# Patient Record
Sex: Male | Born: 1995 | Race: Black or African American | Hispanic: No | Marital: Married | Smoking: Never smoker
Health system: Southern US, Community
[De-identification: ages and names within clinical notes are randomized; demographics above are authoritative.]

---
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
## 2013-06-06 ENCOUNTER — Ambulatory Visit (INDEPENDENT_AMBULATORY_CARE_PROVIDER_SITE_OTHER): Payer: 59 | Admitting: Family Medicine

## 2013-06-06 ENCOUNTER — Encounter: Payer: Self-pay | Admitting: Family Medicine

## 2013-06-06 VITALS — BP 124/78 | Temp 98.6°F | Ht 70.0 in | Wt 202.2 lb

## 2013-06-06 DIAGNOSIS — R112 Nausea with vomiting, unspecified: Secondary | ICD-10-CM

## 2013-06-06 LAB — POCT URINALYSIS DIPSTICK
PH UA: 6
SPEC GRAV UA: 1.02

## 2013-06-06 MED ORDER — ONDANSETRON HCL 8 MG PO TABS: 8.0000 mg | ORAL_TABLET | Freq: Three times a day (TID) | ORAL | Status: DC | PRN

## 2013-06-06 NOTE — Progress Notes (Signed)
   Subjective:    Patient ID: Adam ShearsCharles M [name], male    DOB: 1996/04/08, 18 y.o.   MRN: [id number]  Emesis  This is a new problem. The current episode started in the past 7 days. The problem occurs intermittently. The problem has been unchanged. The emesis has an appearance of stomach contents. The maximum temperature recorded prior to his arrival was 102 - 102.9 F. The fever has been present for less than 1 day. Associated symptoms include diarrhea. The treatment provided no relief.      Review of Systems  Gastrointestinal: Positive for vomiting and diarrhea.   no cough wheeze fevers of gone away had a headache yesterday    Objective:   Physical Exam Lungs clear hearts regular eardrums normal throat is normal neck is supple abdomen soft  Patient not toxic urinalysis reviewed     Assessment & Plan:  Gastroenteritis viral process should gradually get better warning signs discussed call us if problems Zofran as needed for nausea if not doing better by Thursday will need lab work

## 2013-06-07 ENCOUNTER — Telehealth: Payer: Self-pay | Admitting: Family Medicine

## 2013-06-07 NOTE — Telephone Encounter (Signed)
Left message to return call 

## 2013-06-07 NOTE — Telephone Encounter (Signed)
Patient told to call back today if worse from yesterday. He is running a fever today and mom would like to know what the next step is.

## 2013-06-07 NOTE — Telephone Encounter (Signed)
Nurse to call, I recommend CBC liver metabolic 7, lipase stat. Also this patient having any cough sore throat runny nose? How his patient doing in regards to vomiting and diarrhea? What is the fever?

## 2013-06-09 ENCOUNTER — Other Ambulatory Visit: Payer: Self-pay | Admitting: *Deleted

## 2013-06-09 DIAGNOSIS — R1013 Epigastric pain: Principal | ICD-10-CM

## 2013-06-09 DIAGNOSIS — G8929 Other chronic pain: Secondary | ICD-10-CM

## 2013-06-15 NOTE — Telephone Encounter (Signed)
Talked with mom Lawson Fiscal(Lori) and she stated that patient is doing much better. Will call back if symptoms return or persists.

## 2013-07-10 ENCOUNTER — Ambulatory Visit (INDEPENDENT_AMBULATORY_CARE_PROVIDER_SITE_OTHER): Payer: 59 | Admitting: Family Medicine

## 2013-07-10 ENCOUNTER — Encounter: Payer: Self-pay | Admitting: Family Medicine

## 2013-07-10 VITALS — BP 132/80 | Temp 99.3°F | Ht 69.0 in | Wt 200.6 lb

## 2013-07-10 DIAGNOSIS — R197 Diarrhea, unspecified: Secondary | ICD-10-CM

## 2013-07-10 MED ORDER — CIPROFLOXACIN HCL 500 MG PO TABS: 500.0000 mg | ORAL_TABLET | Freq: Two times a day (BID) | ORAL | Status: DC

## 2013-07-10 NOTE — Progress Notes (Signed)
   Subjective:    Patient ID: Adam [name], male    DOB: [birth year], 18 y.o.   MRN: [id number]  Emesis  This is a new problem. The current episode started in the past 7 days. Associated symptoms include diarrhea and a fever.    Persistent symtoms for five days  diminishehed energy Positive history of appendectomy Temp 101.9  Just a little cough  Unable to work, at [employer]  No blood in stools. Moderate malaise. Patient [place] just 12 hours before onset of symptoms. Notes abdominal pain and tenderness intermittently. Review of Systems  Constitutional: Positive for fever.  Gastrointestinal: Positive for vomiting and diarrhea.   No rash ROS otherwise negative    Objective:   Physical Exam  Alert moderate malaise. Temp 99.3 HEENT normal. Lungs clear. Heart regular in rhythm. Abdomen hyperactive bowel sounds. No discrete tenderness. Some mild to moderate tenderness in left lower quadrant.      Assessment & Plan:  Impression probable viral gastroenteritis though cannot exclude foodborne illness discussed plan trial of Cipro 500 twice a day. Zofran when necessary for nausea from prior illness. No prescription antidiarrheals rationale discussed. WSL

## 2013-07-12 ENCOUNTER — Encounter: Payer: Self-pay | Admitting: Family Medicine

## 2013-07-18 ENCOUNTER — Ambulatory Visit (INDEPENDENT_AMBULATORY_CARE_PROVIDER_SITE_OTHER): Payer: 59 | Admitting: Family Medicine

## 2013-07-18 ENCOUNTER — Encounter: Payer: Self-pay | Admitting: Family Medicine

## 2013-07-18 VITALS — BP 122/78 | Ht 69.0 in | Wt 200.0 lb

## 2013-07-18 DIAGNOSIS — M25519 Pain in unspecified shoulder: Secondary | ICD-10-CM

## 2013-07-18 DIAGNOSIS — M75100 Unspecified rotator cuff tear or rupture of unspecified shoulder, not specified as traumatic: Secondary | ICD-10-CM

## 2013-07-18 NOTE — Progress Notes (Signed)
   Subjective:    Patient ID: Adam [name], male    DOB: May 27, 1995, 18 y.o.   MRN: [id number]  Shoulder Pain  The pain is present in the right shoulder. This is a chronic problem. The current episode started more than 1 year ago. He has tried nothing for the symptoms.    patient relates an injury when he played football several yers ago ever since his been weak painful he has a difficult time lifting moving the ar weakness in his scapula that he is noted.m without significant pain sometimes radiates into the arm does not go up into the neck. In addition to this patient also has Significant weakness when he tries to lift or push.  Review of Systems Relates mainly shoulder pain and discomfort denies numbness or tingling in the hands denies chest pain shortness breath.    Objective:   Physical Exam Significant laxity in the right shoulder along with what appears to be a significant tear of the rotator cuff he also has weakness of that right shoulder and his right shoulder blade comes out abnormally when he pushes. Reflexes are good hand strength good. Neck normal. Lungs clear heart regular.       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 We will go ahead and get him set up as soon as possible. I believe that this patient probably didn't need an MRI and possibly reconstruction.

## 2013-07-31 ENCOUNTER — Other Ambulatory Visit (HOSPITAL_COMMUNITY): Payer: Self-pay | Admitting: Orthopedic Surgery

## 2013-07-31 ENCOUNTER — Ambulatory Visit (HOSPITAL_COMMUNITY)
Admission: RE | Admit: 2013-07-31 | Discharge: 2013-07-31 | Disposition: A | Discharge status: Home / Self Care | Payer: 59 | Source: Ambulatory Visit | Attending: Orthopedic Surgery | Admitting: Orthopedic Surgery

## 2013-07-31 DIAGNOSIS — Z01818 Encounter for other preprocedural examination: Secondary | ICD-10-CM

## 2013-07-31 DIAGNOSIS — Z0389 Encounter for observation for other suspected diseases and conditions ruled out: Secondary | ICD-10-CM | POA: Insufficient documentation

## 2013-08-22 ENCOUNTER — Encounter: Payer: Self-pay | Admitting: Family Medicine

## 2013-08-22 ENCOUNTER — Telehealth: Payer: Self-pay | Admitting: Family Medicine

## 2013-08-22 NOTE — Telephone Encounter (Signed)
Please call [phone]

## 2013-08-22 NOTE — Telephone Encounter (Signed)
You may go ahead and write this excuse and I be happy to sign it thank you

## 2013-08-22 NOTE — Telephone Encounter (Signed)
Patient needs a work excuse covering him for 08/18/2013 through 08/21/2013 to return on 08/22/2013 due to his shoulder. 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

## 2013-08-22 NOTE — Telephone Encounter (Signed)
Also, needs to say going back to full duty, no restrictions.

## 2013-08-22 NOTE — Telephone Encounter (Signed)
done

## 2013-10-19 ENCOUNTER — Encounter: Payer: Self-pay | Admitting: Family Medicine

## 2013-10-19 ENCOUNTER — Ambulatory Visit (INDEPENDENT_AMBULATORY_CARE_PROVIDER_SITE_OTHER): Payer: 59 | Admitting: Family Medicine

## 2013-10-19 VITALS — BP 128/88 | Temp 98.6°F | Ht 69.0 in | Wt 207.0 lb

## 2013-10-19 DIAGNOSIS — R109 Unspecified abdominal pain: Secondary | ICD-10-CM

## 2013-10-19 MED ORDER — DICYCLOMINE HCL 20 MG PO TABS: 20.0000 mg | ORAL_TABLET | Freq: Three times a day (TID) | ORAL | Status: DC | PRN

## 2013-10-19 NOTE — Progress Notes (Signed)
   Subjective:    Patient ID: Adam [name], male    DOB: [birth year], 18 y.o.   MRN: [id number]  Abdominal Pain This is a new problem. Episode onset: Friday. The problem occurs intermittently. The pain is located in the RLQ. The quality of the pain is dull. The abdominal pain does not radiate. Associated symptoms include constipation and diarrhea. Nothing aggravates the pain. The pain is relieved by nothing. He has tried nothing for the symptoms.   The pain is actually more in the epigastric area and right midabdomen. PMH benign  Review of Systems  Gastrointestinal: Positive for abdominal pain, diarrhea and constipation.   denies fevers bloody stools dysuria     Objective:   Physical Exam  Vitals reviewed. Constitutional: He appears well-nourished. No distress.  Cardiovascular: Normal rate, regular rhythm and normal heart sounds.   No murmur heard. Pulmonary/Chest: Effort normal and breath sounds normal. No respiratory distress.  Abdominal: Soft. There is tenderness.  Musculoskeletal: He exhibits no edema.  Lymphadenopathy:    He has no cervical adenopathy.  Neurological: He is alert.  Psychiatric: His behavior is normal.    On physical exam tenderness is in the epigastric area there is no right lower quadrant tenderness I do not feel the patient needs CT scan      Assessment & Plan:  I feel this patient's abdominal pain more than likely virus but there is posterior to the pancreatic issues we will do lab work. He will use medication help the discomfort if he is not doing well by Monday he is to let us now at this present time did did not need for him to see gastroenterology or endoscopy.

## 2013-10-19 NOTE — Patient Instructions (Signed)

## 2013-12-06 ENCOUNTER — Encounter (HOSPITAL_COMMUNITY): Payer: Self-pay | Admitting: Emergency Medicine

## 2013-12-06 ENCOUNTER — Emergency Department (HOSPITAL_COMMUNITY)
Admission: EM | Admit: 2013-12-06 | Discharge: 2013-12-06 | Disposition: A | Discharge status: Home / Self Care | Payer: 59 | Attending: Emergency Medicine | Admitting: Emergency Medicine

## 2013-12-06 ENCOUNTER — Telehealth: Payer: Self-pay | Admitting: Family Medicine

## 2013-12-06 DIAGNOSIS — K219 Gastro-esophageal reflux disease without esophagitis: Secondary | ICD-10-CM | POA: Insufficient documentation

## 2013-12-06 DIAGNOSIS — I10 Essential (primary) hypertension: Secondary | ICD-10-CM | POA: Insufficient documentation

## 2013-12-06 DIAGNOSIS — E119 Type 2 diabetes mellitus without complications: Secondary | ICD-10-CM | POA: Insufficient documentation

## 2013-12-06 DIAGNOSIS — R51 Headache: Secondary | ICD-10-CM | POA: Insufficient documentation

## 2013-12-06 DIAGNOSIS — R002 Palpitations: Secondary | ICD-10-CM | POA: Insufficient documentation

## 2013-12-06 DIAGNOSIS — Z87891 Personal history of nicotine dependence: Secondary | ICD-10-CM | POA: Insufficient documentation

## 2013-12-06 LAB — CBG MONITORING, ED: Glucose-Capillary: 106 mg/dL — ABNORMAL HIGH (ref 70–99)

## 2013-12-06 MED ORDER — OMEPRAZOLE 20 MG PO CPDR: 20.0000 mg | DELAYED_RELEASE_CAPSULE | Freq: Every day | ORAL | Status: AC

## 2013-12-06 NOTE — Discharge Instructions (Signed)
Gastroesophageal Reflux Disease, Adult Gastroesophageal reflux disease (GERD) happens when acid from your stomach flows up into the esophagus. When acid comes in contact with the esophagus, the acid causes soreness (inflammation) in the esophagus. Over time, GERD may create small holes (ulcers) in the lining of the esophagus. CAUSES   Increased body weight. This puts pressure on the stomach, making acid rise from the stomach into the esophagus.  Smoking. This increases acid production in the stomach.  Drinking alcohol. This causes decreased pressure in the lower esophageal sphincter (valve or ring of muscle between the esophagus and stomach), allowing acid from the stomach into the esophagus.  Late evening meals and a full stomach. This increases pressure and acid production in the stomach.  A malformed lower esophageal sphincter. Sometimes, no cause is found. SYMPTOMS   Burning pain in the lower part of the mid-chest behind the breastbone and in the mid-stomach area. This may occur twice a week or more often.  Trouble swallowing.  Sore throat.  Dry cough.  Asthma-like symptoms including chest tightness, shortness of breath, or wheezing. DIAGNOSIS  Your caregiver may be able to diagnose GERD based on your symptoms. In some cases, X-rays and other tests may be done to check for complications or to check the condition of your stomach and esophagus. TREATMENT  Your caregiver may recommend over-the-counter or prescription medicines to help decrease acid production. Ask your caregiver before starting or adding any new medicines.  HOME CARE INSTRUCTIONS   Change the factors that you can control. Ask your caregiver for guidance concerning weight loss, quitting smoking, and alcohol consumption.  Avoid foods and drinks that make your symptoms worse, such as:  Caffeine or alcoholic drinks.  Chocolate.  Peppermint or mint flavorings.  Garlic and onions.  Spicy foods.  Citrus fruits,  such as oranges, lemons, or limes.  Tomato-based foods such as sauce, chili, salsa, and pizza.  Fried and fatty foods.  Avoid lying down for the 3 hours prior to your bedtime or prior to taking a nap.  Eat small, frequent meals instead of large meals.  Wear loose-fitting clothing. Do not wear anything tight around your waist that causes pressure on your stomach.  Raise the head of your bed 6 to 8 inches with wood blocks to help you sleep. Extra pillows will not help.  Only take over-the-counter or prescription medicines for pain, discomfort, or fever as directed by your caregiver.  Do not take aspirin, ibuprofen, or other nonsteroidal anti-inflammatory drugs (NSAIDs). SEEK IMMEDIATE MEDICAL CARE IF:   You have pain in your arms, neck, jaw, teeth, or back.  Your pain increases or changes in intensity or duration.  You develop nausea, vomiting, or sweating (diaphoresis).  You develop shortness of breath, or you faint.  Your vomit is green, yellow, black, or looks like coffee grounds or blood.  Your stool is red, bloody, or black. These symptoms could be signs of other problems, such as heart disease, gastric bleeding, or esophageal bleeding. MAKE SURE YOU:   Understand these instructions.  Will watch your condition.  Will get help right away if you are not doing well or get worse. Document Released: 02/11/2005 Document Revised: 07/27/2011 Document Reviewed: 11/21/2010 [employer] Patient Information 2015 [employer]. This information is not intended to replace advice given to you by your health care provider. Make sure you discuss any questions you have with your health care provider.  

## 2013-12-06 NOTE — ED Provider Notes (Signed)
Medical screening examination/treatment/procedure(s) were performed by non-physician practitioner and as supervising physician I was immediately available for consultation/collaboration.   EKG Interpretation None       Doug SouSam Quetzal Meany, MD 12/06/13 2337

## 2013-12-06 NOTE — Telephone Encounter (Signed)
Patient has been out of work for 7/17, 7/20, 7/21, 12/06/13 due to possible hypertension. He would like a work excuse excusing him for these days to return to work on 12/07/2013. It needs to include "may return to work on 12/07/2013 with no restrictions due to possible hypertension." Please fax to [phone].

## 2013-12-06 NOTE — ED Notes (Signed)
Per pt. Heart has been fluttering and feels that BP is elevated. Here for a follow up.

## 2013-12-06 NOTE — Telephone Encounter (Signed)
I spoke to patient to make sure nothing else was going on. He told me that he felt dizzy on Friday, he felt like he had increased HR, and he was a little congested. He denied chest pain, pressure, heaviness, and SOB. I told him if he develops any of these s/s, that he needs to go to ER. He verbalized understanding. He has an appt with Dr. Lorin PicketScott on 12/18/2013

## 2013-12-06 NOTE — ED Notes (Signed)
Courtney, PA-C, is at the bedside.  

## 2013-12-06 NOTE — ED Provider Notes (Signed)
CSN: [id number]     Arrival date & time 12/06/13  1719 History   This chart was scribed for Adam Piedra, PA-C working with Doug Sou, MD by Evon Slack, ED Scribe. This patient was seen in room TR07C/TR07C and the patient's care was started at 7:03 PM.      Chief Complaint  Patient presents with  . Follow-up   The history is provided by the patient. No language interpreter was used.   HPI Comments: Adam [name] is a 18 y.o. male who presents to the Emergency Department with multiple complaints.  Patient states that he is concerned that he is having palpitation, he is hypertensive, diabetic, and has indigestion.  Patient states that 5 days ago he felt that his heart was racing in his chest.  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  At that time his HR was 150 and his BP was 150/90.  He states that this lasted for 45 minutes and then went away.  Patient also states that he is concerned for diabetes.  He took his blood sugar an hour after eating and had a blood sugar of 120.  Patient's mother has both diabetes and HTN.  Patient has also noticed a one week history of burning substernal pain after eating.  He states that it feels like heartburn.  He is not currently taking any medications at this time.  He denies caffeine use.  He recently quit smoking last week.    History reviewed. No pertinent past medical history. Past Surgical History  Procedure Laterality Date  . Laparoscopic appendectomy  02/14/2012    Procedure: APPENDECTOMY LAPAROSCOPIC;  Surgeon: Fabio Bering, MD;  Location: [facility];  Service: General;  Laterality: N/A;   History reviewed. No pertinent family history. History  Substance Use Topics  . Smoking status: Former Smoker -- 0.25 packs/day    Types: Cigarettes    Quit date: 02/15/2012  . Smokeless tobacco: Not on file  . Alcohol Use: No    Review of Systems  Constitutional: Negative for fever, chills and fatigue.  Respiratory: Negative for  cough, chest tightness and shortness of breath.   Cardiovascular: Positive for palpitations. Negative for chest pain and leg swelling.  Gastrointestinal: Negative for nausea, vomiting, abdominal pain, diarrhea, constipation and blood in stool.  Genitourinary: Negative for hematuria.  Neurological: Positive for headaches. Negative for dizziness, syncope, speech difficulty, weakness, light-headedness and numbness.  All other systems reviewed and are negative.  Allergies  Review of patient's allergies indicates no known allergies.  Home Medications   Prior to Admission medications   Medication Sig Start Date End Date Taking? Authorizing Provider  omeprazole (PRILOSEC) 20 MG capsule Take 1 capsule (20 mg total) by mouth daily. 12/06/13   Mirissa Lopresti A Forcucci, PA-C   Triage Vitals: BP 129/75  Pulse 98  Temp(Src) 98.3 F (36.8 C) (Oral)  Resp 18  SpO2 99%  Physical Exam  Nursing note and vitals reviewed. Constitutional: He is oriented to person, place, and time. He appears well-developed and well-nourished. No distress.  HENT:  Head: Normocephalic and atraumatic.  Mouth/Throat: Oropharynx is clear and moist. No oropharyngeal exudate.  Eyes: Conjunctivae and EOM are normal. Pupils are equal, round, and reactive to light. No scleral icterus.  Neck: Normal range of motion. Neck supple. No tracheal deviation present. No thyromegaly present.  Cardiovascular: Normal rate, regular rhythm and normal heart sounds.  Exam reveals no gallop.   No murmur heard. Pulmonary/Chest: Effort normal and breath  sounds normal. No respiratory distress. He has no wheezes. He has no rales. He exhibits no tenderness.  Musculoskeletal: Normal range of motion.  Lymphadenopathy:    He has no cervical adenopathy.  Neurological: He is alert and oriented to person, place, and time. He has normal reflexes. No cranial nerve deficit or sensory deficit. Coordination normal.  Skin: Skin is warm and dry.  Psychiatric: He  has a normal mood and affect. His behavior is normal. Judgment and thought content normal.    ED Course  Procedures (including critical care time) DIAGNOSTIC STUDIES: Oxygen Saturation is 99% on RA, normal by my interpretation.    COORDINATION OF CARE:    Labs Review Labs Reviewed  CBG MONITORING, ED - Abnormal; Notable for the following:    Glucose-Capillary 106 (*)    All other components within normal limits    Imaging Review No results found.   EKG Interpretation None      MDM   Final diagnoses:  Gastroesophageal reflux disease without esophagitis   Patient is a 18 y.o. Male who presents to the ED with multiple complaints.  He was reassured that BP here is normal and glucose is also normal at this time.  Patient does have GERD symptoms.  Will prescribe omeprazole at this time.  Patient is not complaining of palpitations at this time.  He was offered an EKG at this time which he declined.  Patient was told to follow-up with his PCP for further discussion of HTN and diabetes.  There are no abnormal physical exam findings at this time.  Patient is stable for discharge.  I personally performed the services described in this documentation, which was scribed in my presence. The recorded information has been reviewed and is accurate.      Eben Burowourtney A Forcucci, PA-C 12/06/13 1955

## 2013-12-06 NOTE — ED Notes (Signed)
He states "i am worried i have pre-hypertension and diabetes." when asked if he has any complaints he states "im congested." he states he checked his blood sugar at home and it was 120

## 2013-12-06 NOTE — Telephone Encounter (Signed)
Pt would also like a call to tell him we faxed the note  Once Dr Brett CanalesSteve signs off on it

## 2013-12-07 ENCOUNTER — Encounter: Payer: Self-pay | Admitting: Family Medicine

## 2013-12-07 NOTE — Telephone Encounter (Signed)
He may have a note as requested for those dates. It should also stay on there that he was checked out in the ER and his blood pressure was good and he is suitable to return to work without restriction

## 2013-12-07 NOTE — Telephone Encounter (Signed)
Spoke with patient, note faxed.

## 2013-12-18 ENCOUNTER — Ambulatory Visit: Payer: 59 | Admitting: Nurse Practitioner

## 2014-01-02 ENCOUNTER — Emergency Department (HOSPITAL_BASED_OUTPATIENT_CLINIC_OR_DEPARTMENT_OTHER)
Admission: EM | Admit: 2014-01-02 | Discharge: 2014-01-02 | Disposition: A | Discharge status: Home / Self Care | Payer: Worker's Compensation | Attending: Emergency Medicine | Admitting: Emergency Medicine

## 2014-01-02 ENCOUNTER — Encounter (HOSPITAL_BASED_OUTPATIENT_CLINIC_OR_DEPARTMENT_OTHER): Payer: Self-pay | Admitting: Emergency Medicine

## 2014-01-02 DIAGNOSIS — Y990 Civilian activity done for income or pay: Secondary | ICD-10-CM | POA: Diagnosis not present

## 2014-01-02 DIAGNOSIS — W268XXA Contact with other sharp object(s), not elsewhere classified, initial encounter: Secondary | ICD-10-CM | POA: Diagnosis not present

## 2014-01-02 DIAGNOSIS — Y9389 Activity, other specified: Secondary | ICD-10-CM | POA: Insufficient documentation

## 2014-01-02 DIAGNOSIS — Y9289 Other specified places as the place of occurrence of the external cause: Secondary | ICD-10-CM | POA: Diagnosis not present

## 2014-01-02 DIAGNOSIS — Z23 Encounter for immunization: Secondary | ICD-10-CM | POA: Insufficient documentation

## 2014-01-02 DIAGNOSIS — S61409A Unspecified open wound of unspecified hand, initial encounter: Secondary | ICD-10-CM | POA: Diagnosis present

## 2014-01-02 DIAGNOSIS — Z87891 Personal history of nicotine dependence: Secondary | ICD-10-CM | POA: Diagnosis not present

## 2014-01-02 DIAGNOSIS — IMO0002 Reserved for concepts with insufficient information to code with codable children: Secondary | ICD-10-CM

## 2014-01-02 DIAGNOSIS — Z79899 Other long term (current) drug therapy: Secondary | ICD-10-CM | POA: Diagnosis not present

## 2014-01-02 MED ORDER — TETANUS-DIPHTH-ACELL PERTUSSIS 5-2.5-18.5 LF-MCG/0.5 IM SUSP
0.5000 mL | Freq: Once | INTRAMUSCULAR | Status: AC
  Administered 2014-01-02: 0.5 mL via INTRAMUSCULAR
  Filled 2014-01-02: qty 0.5

## 2014-01-02 NOTE — ED Notes (Signed)
Suture cart placed at bedside. 

## 2014-01-02 NOTE — ED Notes (Addendum)
Patient states he cut his left hand on a piece of metal today at work. Patient states the laceration was "deep enough to see a tendon". Bandage in place at this time, bleeding controlled. Patient is unaware of his last tetanus shot. Patient is A&Ox4, NAD noted.

## 2014-01-02 NOTE — ED Provider Notes (Signed)
CSN: [id number]     Arrival date & time 01/02/14  0006 History   First MD Initiated Contact with Patient 01/02/14 0144     Chief Complaint  Patient presents with  . Extremity Laceration     (Consider location/radiation/quality/duration/timing/severity/associated sxs/prior Treatment) Patient is a 18 y.o. male presenting with skin laceration. The history is provided by the patient.  Laceration Location:  Hand Hand laceration location:  Dorsum of L hand Depth:  Through dermis Quality: straight   Bleeding: controlled   Laceration mechanism:  Metal edge Pain details:    Quality:  Aching   Severity:  Mild   Timing:  Constant   Progression:  Unchanged Foreign body present:  No foreign bodies Relieved by:  Nothing Worsened by:  Nothing tried Ineffective treatments:  None tried Tetanus status:  Unknown   History reviewed. No pertinent past medical history. Past Surgical History  Procedure Laterality Date  . Laparoscopic appendectomy  02/14/2012    Procedure: APPENDECTOMY LAPAROSCOPIC;  Surgeon: Fabio BeringBrent C Ziegler, MD;  Location: [facility];  Service: General;  Laterality: N/A;   History reviewed. No pertinent family history. History  Substance Use Topics  . Smoking status: Former Smoker -- 0.25 packs/day    Types: Cigarettes    Quit date: 02/15/2012  . Smokeless tobacco: Not on file  . Alcohol Use: No    Review of Systems  Skin: Positive for wound.  All other systems reviewed and are negative.     Allergies  Review of patient's allergies indicates no known allergies.  Home Medications   Prior to Admission medications   Medication Sig Start Date End Date Taking? Authorizing Provider  omeprazole (PRILOSEC) 20 MG capsule Take 1 capsule (20 mg total) by mouth daily. 12/06/13   Courtney A Forcucci, PA-C   BP 130/74  Pulse 75  Temp(Src) 98.4 F (36.9 C) (Oral)  Resp 16  Ht 5\' 9"  (1.753 m)  Wt 210 lb (95.255 kg)  BMI 31.00 kg/m2  SpO2 99% Physical Exam  Constitutional:  He is oriented to person, place, and time. He appears well-developed and well-nourished. No distress.  HENT:  Head: Normocephalic and atraumatic.  Eyes: EOM are normal.  Neck: Normal range of motion. Neck supple.  Cardiovascular: Normal rate, regular rhythm and intact distal pulses.   Pulmonary/Chest: Effort normal and breath sounds normal.  Abdominal: Soft. Bowel sounds are normal.  Musculoskeletal: Normal range of motion.       Left hand: He exhibits normal range of motion, no tenderness, no bony tenderness, normal two-point discrimination, normal capillary refill, no deformity, no laceration and no swelling. Normal sensation noted. Normal strength noted.       Hands: Neurological: He is alert and oriented to person, place, and time.  Skin: Skin is warm and dry.  Psychiatric: He has a normal mood and affect.    ED Course  Procedures (including critical care time) Labs Review Labs Reviewed - No data to display  Imaging Review No results found.   EKG Interpretation None      MDM   Final diagnoses:  Laceration    LACERATION REPAIR Performed by: Jasmine AwePALUMBO-RASCH,Velma Agnes K Authorized by: Jasmine AwePALUMBO-RASCH,Halah Whiteside K Consent: Verbal consent obtained. Risks and benefits: risks, benefits and alternatives were discussed Consent given by: patient Patient identity confirmed: provided demographic data Prepped and Draped in normal sterile fashion Wound explored  Laceration Location: left dorsal hand  Laceration Length: 1 cm  No Foreign Bodies seen or palpated  Irrigation method: syringe Amount of cleaning: standard  Skin closure: dermabond  Technique: dermabond  Patient tolerance: Patient tolerated the procedure well with no immediate complications.     Jasmine Awe, MD 01/02/14 [phone]

## 2014-01-02 NOTE — ED Notes (Signed)
Pt c/o laceration to left hand by metal x 1 hr ago

## 2014-01-09 ENCOUNTER — Ambulatory Visit (INDEPENDENT_AMBULATORY_CARE_PROVIDER_SITE_OTHER): Payer: 59 | Admitting: Family Medicine

## 2014-01-09 ENCOUNTER — Encounter: Payer: Self-pay | Admitting: Family Medicine

## 2014-01-09 VITALS — BP 124/84 | Ht 69.0 in | Wt 202.0 lb

## 2014-01-09 DIAGNOSIS — J310 Chronic rhinitis: Secondary | ICD-10-CM

## 2014-01-09 DIAGNOSIS — J329 Chronic sinusitis, unspecified: Secondary | ICD-10-CM

## 2014-01-09 MED ORDER — CEFDINIR 300 MG PO CAPS
300.0000 mg | ORAL_CAPSULE | Freq: Two times a day (BID) | ORAL | Status: AC
Start: 2014-01-09 — End: ?

## 2014-01-09 NOTE — Progress Notes (Signed)
   Subjective:    Patient ID: Adam [name], male    DOB: [birth year], 18 y.o.   MRN: [id number]  HPI Patient has been having a headache since last Friday. Patient states that he has experienced some dizziness. Patient states he has had a increase in his bowel movements and they are normal.  Congestion at times, unsteady,  Using two motrin prn head ache  Patient has a tendency toward sinus infections. Some sore throat morning. Some unsteadiness. Headache primarily frontal. Somewhat diminished energy. Next  No fever. No rash. No tick bite.   Review of Systems No vomiting no diarrhea see above ROS otherwise negative    Objective:   Physical Exam  Alert slight malaise no acute distress. Lungs clear. Heart rare in rhythm. HEENT normal other than frontal maxillary fullness and congestion      Assessment & Plan:  Impression rhinosinusitis plan antibiotics prescribed. Symptomatic care discussed. WSL

## 2014-05-09 ENCOUNTER — Emergency Department (HOSPITAL_COMMUNITY): Payer: No Typology Code available for payment source

## 2014-05-09 ENCOUNTER — Encounter (HOSPITAL_COMMUNITY): Payer: Self-pay | Admitting: *Deleted

## 2014-05-09 ENCOUNTER — Emergency Department (HOSPITAL_COMMUNITY)
Admission: EM | Admit: 2014-05-09 | Discharge: 2014-05-09 | Disposition: A | Discharge status: Home / Self Care | Payer: No Typology Code available for payment source | Attending: Emergency Medicine | Admitting: Emergency Medicine

## 2014-05-09 DIAGNOSIS — S299XXA Unspecified injury of thorax, initial encounter: Secondary | ICD-10-CM | POA: Diagnosis not present

## 2014-05-09 DIAGNOSIS — R1084 Generalized abdominal pain: Secondary | ICD-10-CM

## 2014-05-09 DIAGNOSIS — R0789 Other chest pain: Secondary | ICD-10-CM

## 2014-05-09 DIAGNOSIS — S3991XA Unspecified injury of abdomen, initial encounter: Secondary | ICD-10-CM | POA: Diagnosis not present

## 2014-05-09 DIAGNOSIS — R0781 Pleurodynia: Secondary | ICD-10-CM

## 2014-05-09 DIAGNOSIS — Z79899 Other long term (current) drug therapy: Secondary | ICD-10-CM | POA: Insufficient documentation

## 2014-05-09 DIAGNOSIS — Y9389 Activity, other specified: Secondary | ICD-10-CM | POA: Diagnosis not present

## 2014-05-09 DIAGNOSIS — Y92410 Unspecified street and highway as the place of occurrence of the external cause: Secondary | ICD-10-CM | POA: Insufficient documentation

## 2014-05-09 DIAGNOSIS — Z87891 Personal history of nicotine dependence: Secondary | ICD-10-CM | POA: Insufficient documentation

## 2014-05-09 DIAGNOSIS — Y998 Other external cause status: Secondary | ICD-10-CM | POA: Insufficient documentation

## 2014-05-09 LAB — COMPREHENSIVE METABOLIC PANEL
ALT: 13 U/L (ref 0–53)
AST: 15 U/L (ref 0–37)
Albumin: 4.1 g/dL (ref 3.5–5.2)
Alkaline Phosphatase: 61 U/L (ref 39–117)
Anion gap: 6 (ref 5–15)
BILIRUBIN TOTAL: 0.5 mg/dL (ref 0.3–1.2)
BUN: 10 mg/dL (ref 6–23)
CHLORIDE: 107 meq/L (ref 96–112)
CO2: 26 mmol/L (ref 19–32)
CREATININE: 0.97 mg/dL (ref 0.50–1.35)
Calcium: 9.2 mg/dL (ref 8.4–10.5)
GLUCOSE: 97 mg/dL (ref 70–99)
Potassium: 3.7 mmol/L (ref 3.5–5.1)
Sodium: 139 mmol/L (ref 135–145)
Total Protein: 6.7 g/dL (ref 6.0–8.3)

## 2014-05-09 LAB — URINALYSIS, ROUTINE W REFLEX MICROSCOPIC
GLUCOSE, UA: NEGATIVE mg/dL
HGB URINE DIPSTICK: NEGATIVE
Ketones, ur: 15 mg/dL — AB
LEUKOCYTES UA: NEGATIVE
Nitrite: NEGATIVE
PH: 7 (ref 5.0–8.0)
Protein, ur: NEGATIVE mg/dL
Specific Gravity, Urine: 1.029 (ref 1.005–1.030)
Urobilinogen, UA: 1 mg/dL (ref 0.0–1.0)

## 2014-05-09 LAB — CBC WITH DIFFERENTIAL/PLATELET
BASOS ABS: 0 10*3/uL (ref 0.0–0.1)
Basophils Relative: 0 % (ref 0–1)
EOS PCT: 1 % (ref 0–5)
Eosinophils Absolute: 0.1 10*3/uL (ref 0.0–0.7)
HEMATOCRIT: 41.1 % (ref 39.0–52.0)
HEMOGLOBIN: 14.4 g/dL (ref 13.0–17.0)
LYMPHS ABS: 1.9 10*3/uL (ref 0.7–4.0)
LYMPHS PCT: 24 % (ref 12–46)
MCH: 31.2 pg (ref 26.0–34.0)
MCHC: 35 g/dL (ref 30.0–36.0)
MCV: 89.2 fL (ref 78.0–100.0)
MONO ABS: 0.6 10*3/uL (ref 0.1–1.0)
MONOS PCT: 7 % (ref 3–12)
NEUTROS ABS: 5.6 10*3/uL (ref 1.7–7.7)
Neutrophils Relative %: 68 % (ref 43–77)
Platelets: 174 10*3/uL (ref 150–400)
RBC: 4.61 MIL/uL (ref 4.22–5.81)
RDW: 12.2 % (ref 11.5–15.5)
WBC: 8.2 10*3/uL (ref 4.0–10.5)

## 2014-05-09 LAB — LIPASE, BLOOD: LIPASE: 24 U/L (ref 11–59)

## 2014-05-09 MED ORDER — HYDROCODONE-ACETAMINOPHEN 5-325 MG PO TABS
1.0000 | ORAL_TABLET | Freq: Once | ORAL | Status: AC
  Administered 2014-05-09: 1 via ORAL
  Filled 2014-05-09: qty 1

## 2014-05-09 MED ORDER — IBUPROFEN 800 MG PO TABS: 800.0000 mg | ORAL_TABLET | Freq: Three times a day (TID) | ORAL | Status: AC

## 2014-05-09 MED ORDER — IOHEXOL 300 MG/ML  SOLN
100.0000 mL | Freq: Once | INTRAMUSCULAR | Status: AC | PRN
  Administered 2014-05-09: 100 mL via INTRAVENOUS

## 2014-05-09 MED ORDER — CYCLOBENZAPRINE HCL 5 MG PO TABS: 5.0000 mg | ORAL_TABLET | Freq: Three times a day (TID) | ORAL | Status: AC | PRN
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
## 2014-05-09 NOTE — ED Notes (Signed)
Patient transported to X-ray 

## 2014-05-09 NOTE — ED Notes (Signed)
MD at bedside. 
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
## 2014-05-09 NOTE — Discharge Instructions (Signed)

## 2014-05-31 ENCOUNTER — Ambulatory Visit: Payer: Self-pay | Admitting: Family Medicine

## 2014-06-11 ENCOUNTER — Ambulatory Visit: Payer: Self-pay | Admitting: Family Medicine

## 2014-09-21 ENCOUNTER — Emergency Department (HOSPITAL_COMMUNITY): Payer: Self-pay

## 2014-09-21 ENCOUNTER — Encounter (HOSPITAL_COMMUNITY): Payer: Self-pay | Admitting: Emergency Medicine

## 2014-09-21 ENCOUNTER — Emergency Department (HOSPITAL_COMMUNITY)
Admission: EM | Admit: 2014-09-21 | Discharge: 2014-09-21 | Disposition: A | Discharge status: Home / Self Care | Payer: Self-pay | Attending: Emergency Medicine | Admitting: Emergency Medicine

## 2014-09-21 DIAGNOSIS — R1032 Left lower quadrant pain: Secondary | ICD-10-CM | POA: Insufficient documentation

## 2014-09-21 DIAGNOSIS — Z87891 Personal history of nicotine dependence: Secondary | ICD-10-CM | POA: Insufficient documentation

## 2014-09-21 DIAGNOSIS — R112 Nausea with vomiting, unspecified: Secondary | ICD-10-CM | POA: Insufficient documentation

## 2014-09-21 LAB — CBC WITH DIFFERENTIAL/PLATELET
BASOS PCT: 0 % (ref 0–1)
Basophils Absolute: 0 10*3/uL (ref 0.0–0.1)
Eosinophils Absolute: 0.2 10*3/uL (ref 0.0–0.7)
Eosinophils Relative: 2 % (ref 0–5)
HEMATOCRIT: 43.1 % (ref 39.0–52.0)
Hemoglobin: 14.4 g/dL (ref 13.0–17.0)
LYMPHS PCT: 24 % (ref 12–46)
Lymphs Abs: 1.7 10*3/uL (ref 0.7–4.0)
MCH: 30.4 pg (ref 26.0–34.0)
MCHC: 33.4 g/dL (ref 30.0–36.0)
MCV: 91.1 fL (ref 78.0–100.0)
Monocytes Absolute: 0.5 10*3/uL (ref 0.1–1.0)
Monocytes Relative: 7 % (ref 3–12)
NEUTROS ABS: 4.9 10*3/uL (ref 1.7–7.7)
NEUTROS PCT: 67 % (ref 43–77)
PLATELETS: 205 10*3/uL (ref 150–400)
RBC: 4.73 MIL/uL (ref 4.22–5.81)
RDW: 12.3 % (ref 11.5–15.5)
WBC: 7.3 10*3/uL (ref 4.0–10.5)

## 2014-09-21 LAB — COMPREHENSIVE METABOLIC PANEL
ALT: 22 U/L (ref 17–63)
AST: 24 U/L (ref 15–41)
Albumin: 4.1 g/dL (ref 3.5–5.0)
Alkaline Phosphatase: 83 U/L (ref 38–126)
Anion gap: 10 (ref 5–15)
BILIRUBIN TOTAL: 0.4 mg/dL (ref 0.3–1.2)
BUN: 9 mg/dL (ref 6–20)
CALCIUM: 9.5 mg/dL (ref 8.9–10.3)
CO2: 27 mmol/L (ref 22–32)
CREATININE: 1 mg/dL (ref 0.61–1.24)
Chloride: 104 mmol/L (ref 101–111)
GFR calc Af Amer: >60 mL/min (ref >60)
GLUCOSE: 97 mg/dL (ref 70–99)
Potassium: 3.9 mmol/L (ref 3.5–5.1)
Sodium: 141 mmol/L (ref 135–145)
Total Protein: 7.4 g/dL (ref 6.5–8.1)

## 2014-09-21 LAB — URINALYSIS, ROUTINE W REFLEX MICROSCOPIC
BILIRUBIN URINE: NEGATIVE
GLUCOSE, UA: NEGATIVE mg/dL
HGB URINE DIPSTICK: NEGATIVE
Ketones, ur: NEGATIVE mg/dL
LEUKOCYTES UA: NEGATIVE
Nitrite: NEGATIVE
PROTEIN: NEGATIVE mg/dL
Specific Gravity, Urine: 1.012 (ref 1.005–1.030)
Urobilinogen, UA: 0.2 mg/dL (ref 0.0–1.0)
pH: 7 (ref 5.0–8.0)

## 2014-09-21 LAB — LIPASE, BLOOD: Lipase: 23 U/L (ref 22–51)

## 2014-09-21 MED ORDER — ONDANSETRON HCL 4 MG/2ML IJ SOLN
4.0000 mg | Freq: Once | INTRAMUSCULAR | Status: AC
  Administered 2014-09-21: 4 mg via INTRAVENOUS
  Filled 2014-09-21: qty 2

## 2014-09-21 MED ORDER — MORPHINE SULFATE 4 MG/ML IJ SOLN
4.0000 mg | Freq: Once | INTRAMUSCULAR | Status: AC
  Administered 2014-09-21: 4 mg via INTRAVENOUS
  Filled 2014-09-21: qty 1

## 2014-09-21 NOTE — ED Provider Notes (Signed)
CSN: [id number]     Arrival date & time 09/21/14  0001 History    This chart was scribed for Pricilla LovelessScott Sorina Derrig, MD by Freida Busmaniana Omoyeni, ED Scribe. This patient was seen in room D31C/D31C and the patient's care was started 12:31 AM.  Chief Complaint  Patient presents with  . Abdominal Pain   The history is provided by the patient. No language interpreter was used.     HPI Comments:  Adam [name] is a 19 y.o. male who presents to the Emergency Department complaining of shooting, sharp, non-radiating, sudden onset, left lower abdominal pain for the past 2 hours. He reports constant pain that waxes and wanes in severity. His pain is a  6/10 at this time. He reports associated nausea and one episode of vomiting ~2200/2230.  He denies testicular pain. He also denies a h/o kidney stones. No alleviating factors noted. Pt states he had an appendectomy a few years ago but a subsequent hospital visit showed he still had his appendix. He notes pain at this time is similar to pain felt prior to the appendectomy.   History reviewed. No pertinent past medical history. Past Surgical History  Procedure Laterality Date  . Laparoscopic appendectomy  02/14/2012    Procedure: APPENDECTOMY LAPAROSCOPIC;  Surgeon: Fabio BeringBrent C Ziegler, MD;  Location: [facility];  Service: General;  Laterality: N/A;   No family history on file. History  Substance Use Topics  . Smoking status: Former Smoker -- 0.25 packs/day    Types: Cigarettes    Quit date: 02/15/2012  . Smokeless tobacco: Not on file  . Alcohol Use: No    Review of Systems  Constitutional: Negative for fever.  Gastrointestinal: Positive for nausea, vomiting and abdominal pain. Negative for diarrhea and constipation.  Genitourinary: Negative for dysuria, hematuria and testicular pain.  Musculoskeletal: Negative for back pain.  All other systems reviewed and are negative.     Allergies  Review of patient's allergies indicates no known allergies.  Home  Medications   Prior to Admission medications   Medication Sig Start Date End Date Taking? Authorizing Provider  cefdinir (OMNICEF) 300 MG capsule Take 1 capsule (300 mg total) by mouth 2 (two) times daily. Patient not taking: Reported on 05/09/2014 01/09/14   Merlyn AlbertWilliam S Luking, MD  cyclobenzaprine (FLEXERIL) 5 MG tablet Take 1 tablet (5 mg total) by mouth 3 (three) times daily as needed for muscle spasms. Patient not taking: Reported on 09/21/2014 05/09/14   Alvira MondayErin Schlossman, MD  ibuprofen (ADVIL,MOTRIN) 800 MG tablet Take 1 tablet (800 mg total) by mouth 3 (three) times daily. Patient not taking: Reported on 09/21/2014 05/09/14   Alvira MondayErin Schlossman, MD  omeprazole (PRILOSEC) 20 MG capsule Take 1 capsule (20 mg total) by mouth daily. Patient not taking: Reported on 05/09/2014 12/06/13   Toni Amendourtney Forcucci, PA-C   BP 107/92 mmHg  Pulse 82  Temp(Src) 98.6 F (37 C) (Oral)  Resp 14  Ht 5\' 9"  (1.753 m)  Wt 216 lb 3.2 oz (98.068 kg)  BMI 31.91 kg/m2  SpO2 97% Physical Exam  Constitutional: He is oriented to person, place, and time. He appears well-developed and well-nourished. No distress.  HENT:  Head: Normocephalic and atraumatic.  Right Ear: External ear normal.  Left Ear: External ear normal.  Nose: Nose normal.  Eyes: Right eye exhibits no discharge. Left eye exhibits no discharge.  Neck: Neck supple.  Cardiovascular: Normal rate, regular rhythm, normal heart sounds and intact distal pulses.   Pulmonary/Chest: Effort normal.  Abdominal: Soft.  There is tenderness (mild) in the left lower quadrant.  Mild left sided CVA tenderness  Musculoskeletal: He exhibits no edema.  Neurological: He is alert and oriented to person, place, and time.  Skin: Skin is warm and dry. He is not diaphoretic.  Nursing note and vitals reviewed.   ED Course  Procedures   DIAGNOSTIC STUDIES:  Oxygen Saturation is 97% on RA, normal by my interpretation.    COORDINATION OF CARE:  12:34 AM Discussed treatment  plan with pt at bedside and pt agreed to plan.  Labs Review Labs Reviewed  CBC WITH DIFFERENTIAL/PLATELET  COMPREHENSIVE METABOLIC PANEL  LIPASE, BLOOD  URINALYSIS, ROUTINE W REFLEX MICROSCOPIC    Imaging Review Ct Renal Stone Study  09/21/2014   CLINICAL DATA:  Constant pain and left lower quadrant left flank. Nausea. Prior appendectomy.  EXAM: CT ABDOMEN AND PELVIS WITHOUT CONTRAST  TECHNIQUE: Multidetector CT imaging of the abdomen and pelvis was performed following the standard protocol without IV contrast.  COMPARISON:  CT 05/09/2014  FINDINGS: Lower chest: Lung bases are clear.  Hepatobiliary: No focal hepatic lesion. No biliary duct dilatation. Gallbladder is normal. Common bile duct is normal.  Pancreas: Pancreas is normal. No ductal dilatation. No pancreatic inflammation.  Spleen: Normal spleen  Adrenals/urinary tract: Adrenal glands are normal. No nephrolithiasis or ureterolithiasis. No obstructive uropathy. No bladder calculi.  Stomach/Bowel: Stomach, small bowel, and cecum are normal. The colon and rectosigmoid colon are normal.  Vascular/Lymphatic: Abdominal aorta is normal caliber. There is no retroperitoneal or periportal lymphadenopathy. No pelvic lymphadenopathy.  Reproductive: Prostate gland is normal. No inguinal hernia or ventral hernia.  Musculoskeletal: No aggressive osseous lesion.  Other: No free fluid.  IMPRESSION: 1. No nephrolithiasis, ureterolithiasis, or obstructive uropathy. 2. No inguinal or ventral hernia. 3. No diverticulitis.   Electronically Signed   By: Genevive BiStewart  Edmunds M.D.   On: 09/21/2014 01:21     EKG Interpretation None      MDM   Final diagnoses:  Left lower quadrant pain    Lab work and CT imaging is unremarkable. No testicular/GU symptoms. Patient's pain is all left-sided and thus I doubt appendicitis. No signs of obstruction or other acute intra-abdominal emergency. At this point his pain is of unclear etiology but he is well appearing and has  only mild tenderness with a soft, non-peritoneal date abdomen. We'll recommend ibuprofen as needed, as well as follow-up with PCP if pain continues. Discussed strict return precautions.  I personally performed the services described in this documentation, which was scribed in my presence. The recorded information has been reviewed and is accurate.   Pricilla LovelessScott Macaulay Reicher, MD 09/21/14 [phone]

## 2014-09-21 NOTE — ED Notes (Signed)
Pt. reports intermittent left lower abdominal pain with emesis onset today , denies fever or diarrhea.

## 2014-09-21 NOTE — Discharge Instructions (Signed)

## 2014-09-21 NOTE — ED Notes (Signed)
Pt A&OX4, ambulatory at d/c with steady gait, NAD 

## 2020-06-01 ENCOUNTER — Ambulatory Visit
Admission: EM | Admit: 2020-06-01 | Discharge: 2020-06-01 | Disposition: A | Discharge status: Home / Self Care | Payer: PRIVATE HEALTH INSURANCE | Attending: Emergency Medicine | Admitting: Emergency Medicine

## 2020-06-01 ENCOUNTER — Other Ambulatory Visit: Payer: Self-pay

## 2020-06-01 ENCOUNTER — Encounter: Payer: Self-pay | Admitting: Emergency Medicine

## 2020-06-01 DIAGNOSIS — J069 Acute upper respiratory infection, unspecified: Secondary | ICD-10-CM

## 2020-06-01 LAB — POCT INFLUENZA A/B
Influenza A, POC: NEGATIVE
Influenza B, POC: NEGATIVE

## 2020-06-01 NOTE — ED Triage Notes (Signed)
Chills, headache , cold sweats, cough.  Neg rapid covid yesterday
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
## 2020-06-01 NOTE — Discharge Instructions (Signed)
COVID test pending.  If positive self isolate for 5 days, then wear a mask around others for 5 days.  Continue with Dayquil and Nyquil Return for evaluation if you have difficulty breathing.

## 2020-06-04 LAB — NOVEL CORONAVIRUS, NAA: SARS-CoV-2, NAA: DETECTED — AB

## 2021-03-29 ENCOUNTER — Emergency Department (HOSPITAL_COMMUNITY): Payer: No Typology Code available for payment source

## 2021-03-29 ENCOUNTER — Observation Stay (HOSPITAL_COMMUNITY)
Admission: EM | Admit: 2021-03-29 | Discharge: 2021-03-30 | Disposition: A | Discharge status: Home / Self Care | Payer: No Typology Code available for payment source | Attending: Surgery | Admitting: Surgery

## 2021-03-29 ENCOUNTER — Encounter (HOSPITAL_COMMUNITY): Payer: Self-pay

## 2021-03-29 DIAGNOSIS — Y900 Blood alcohol level of less than 20 mg/100 ml: Secondary | ICD-10-CM | POA: Diagnosis not present

## 2021-03-29 DIAGNOSIS — R29898 Other symptoms and signs involving the musculoskeletal system: Secondary | ICD-10-CM | POA: Diagnosis present

## 2021-03-29 DIAGNOSIS — Y92410 Unspecified street and highway as the place of occurrence of the external cause: Secondary | ICD-10-CM | POA: Diagnosis not present

## 2021-03-29 DIAGNOSIS — R202 Paresthesia of skin: Secondary | ICD-10-CM | POA: Insufficient documentation

## 2021-03-29 DIAGNOSIS — S3992XA Unspecified injury of lower back, initial encounter: Principal | ICD-10-CM | POA: Insufficient documentation

## 2021-03-29 DIAGNOSIS — I959 Hypotension, unspecified: Secondary | ICD-10-CM | POA: Insufficient documentation

## 2021-03-29 DIAGNOSIS — Z20822 Contact with and (suspected) exposure to covid-19: Secondary | ICD-10-CM | POA: Insufficient documentation

## 2021-03-29 DIAGNOSIS — T1490XA Injury, unspecified, initial encounter: Secondary | ICD-10-CM

## 2021-03-29 LAB — CBC
HCT: 44.2 % (ref 39.0–52.0)
Hemoglobin: 15.1 g/dL (ref 13.0–17.0)
MCH: 31.5 pg (ref 26.0–34.0)
MCHC: 34.2 g/dL (ref 30.0–36.0)
MCV: 92.1 fL (ref 80.0–100.0)
Platelets: 256 10*3/uL (ref 150–400)
RBC: 4.8 MIL/uL (ref 4.22–5.81)
RDW: 11.9 % (ref 11.5–15.5)
WBC: 8.9 10*3/uL (ref 4.0–10.5)
nRBC: 0 % (ref 0.0–0.2)

## 2021-03-29 LAB — COMPREHENSIVE METABOLIC PANEL
ALT: 21 U/L (ref 0–44)
AST: 18 U/L (ref 15–41)
Albumin: 3.9 g/dL (ref 3.5–5.0)
Alkaline Phosphatase: 71 U/L (ref 38–126)
Anion gap: 11 (ref 5–15)
BUN: 13 mg/dL (ref 6–20)
CO2: 19 mmol/L — ABNORMAL LOW (ref 22–32)
Calcium: 9.3 mg/dL (ref 8.9–10.3)
Chloride: 107 mmol/L (ref 98–111)
Creatinine, Ser: 1.08 mg/dL (ref 0.61–1.24)
GFR, Estimated: >60 mL/min (ref >60)
Glucose, Bld: 120 mg/dL — ABNORMAL HIGH (ref 70–99)
Potassium: 3.4 mmol/L — ABNORMAL LOW (ref 3.5–5.1)
Sodium: 137 mmol/L (ref 135–145)
Total Bilirubin: 0.9 mg/dL (ref 0.3–1.2)
Total Protein: 7.3 g/dL (ref 6.5–8.1)

## 2021-03-29 LAB — RESP PANEL BY RT-PCR (FLU A&B, COVID) ARPGX2
Influenza A by PCR: NEGATIVE
Influenza B by PCR: NEGATIVE
SARS Coronavirus 2 by RT PCR: NEGATIVE

## 2021-03-29 LAB — PROTIME-INR
INR: 1.1 (ref 0.8–1.2)
Prothrombin Time: 13.8 seconds (ref 11.4–15.2)

## 2021-03-29 LAB — LACTIC ACID, PLASMA: Lactic Acid, Venous: 2 mmol/L (ref 0.5–1.9)

## 2021-03-29 LAB — I-STAT CHEM 8, ED
BUN: 14 mg/dL (ref 6–20)
Calcium, Ion: 1.08 mmol/L — ABNORMAL LOW (ref 1.15–1.40)
Chloride: 107 mmol/L (ref 98–111)
Creatinine, Ser: 1.1 mg/dL (ref 0.61–1.24)
Glucose, Bld: 122 mg/dL — ABNORMAL HIGH (ref 70–99)
HCT: 43 % (ref 39.0–52.0)
Hemoglobin: 14.6 g/dL (ref 13.0–17.0)
Potassium: 3.3 mmol/L — ABNORMAL LOW (ref 3.5–5.1)
Sodium: 141 mmol/L (ref 135–145)
TCO2: 21 mmol/L — ABNORMAL LOW (ref 22–32)

## 2021-03-29 LAB — SAMPLE TO BLOOD BANK

## 2021-03-29 LAB — ETHANOL: Alcohol, Ethyl (B): <10 mg/dL (ref <10)
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
## 2021-03-29 MED ORDER — ENOXAPARIN SODIUM 30 MG/0.3ML IJ SOSY: 30.0000 mg | PREFILLED_SYRINGE | Freq: Two times a day (BID) | INTRAMUSCULAR | Status: DC

## 2021-03-29 MED ORDER — IOHEXOL 300 MG/ML  SOLN
98.0000 mL | Freq: Once | INTRAMUSCULAR | Status: AC | PRN
  Administered 2021-03-29: 98 mL via INTRAVENOUS

## 2021-03-29 MED ORDER — ACETAMINOPHEN 325 MG PO TABS: 650.0000 mg | ORAL_TABLET | Freq: Four times a day (QID) | ORAL | Status: DC | PRN

## 2021-03-29 MED ORDER — ONDANSETRON 4 MG PO TBDP: 4.0000 mg | ORAL_TABLET | Freq: Four times a day (QID) | ORAL | Status: DC | PRN

## 2021-03-29 MED ORDER — ONDANSETRON HCL 4 MG/2ML IJ SOLN: 4.0000 mg | Freq: Four times a day (QID) | INTRAMUSCULAR | Status: DC | PRN
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
## 2021-03-29 NOTE — Progress Notes (Signed)
MRI spine completed and shows no acute evidence of spinal cord injury. Patient's symptoms are improving but he is still unable to walk. Will admit for overnight observation and PT eval in am.

## 2021-03-29 NOTE — ED Notes (Signed)
Pt to CT w/ TRN Ladona Ridgel at this time
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
## 2021-03-30 LAB — HIV ANTIBODY (ROUTINE TESTING W REFLEX): HIV Screen 4th Generation wRfx: NONREACTIVE

## 2021-03-30 MED ORDER — ACETAMINOPHEN 325 MG PO TABS: 650.0000 mg | ORAL_TABLET | Freq: Four times a day (QID) | ORAL | 1 refills | Status: AC | PRN
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
## 2021-03-30 NOTE — ED Notes (Signed)
Pt remains resting comfortably in stretcher, additional snacks and beverage provided. Wife remains at bedside, VSS. Neurological exam continues to improve. Sensation improving to blt LE, strength improving as well .

## 2021-03-30 NOTE — ED Notes (Signed)
Neurological exam gradually improving. Pt w/ improving sensation to blt LE, strength improving as well. Reports lessening lower back pain. C-spine cleared by MD Trifan and collar removed. Pt sat up in bed and provided food and beverages as requested. Wife remains at bedside, no further needs identified. Plan to admit for obs per MD

## 2021-03-31 ENCOUNTER — Encounter: Payer: Self-pay | Admitting: Emergency Medicine

## 2021-10-29 ENCOUNTER — Ambulatory Visit: Payer: Self-pay | Admitting: Family Medicine

## 2023-09-01 ENCOUNTER — Other Ambulatory Visit: Payer: Self-pay
